# Patient Record
Sex: Female | Born: 1955 | Race: Black or African American | Hispanic: No | Smoking: Never smoker
Health system: Southern US, Community
[De-identification: ages and names within clinical notes are randomized; demographics above are authoritative.]

## PROBLEM LIST (undated history)

## (undated) DIAGNOSIS — E119 Type 2 diabetes mellitus without complications: Secondary | ICD-10-CM

## (undated) DIAGNOSIS — I10 Essential (primary) hypertension: Secondary | ICD-10-CM

## (undated) HISTORY — PX: APPENDECTOMY: SHX54

## (undated) HISTORY — PX: ABDOMINAL HYSTERECTOMY: SHX81

## (undated) HISTORY — PX: KNEE ARTHROPLASTY: SHX992

---

## 1998-12-06 ENCOUNTER — Encounter: Payer: Self-pay | Admitting: Internal Medicine

## 1998-12-06 ENCOUNTER — Emergency Department (HOSPITAL_COMMUNITY): Admission: EM | Admit: 1998-12-06 | Discharge: 1998-12-06 | Payer: Self-pay | Admitting: Internal Medicine

## 1999-09-27 ENCOUNTER — Encounter: Admission: RE | Admit: 1999-09-27 | Discharge: 1999-09-27 | Payer: Self-pay | Admitting: Orthopaedic Surgery

## 1999-09-27 ENCOUNTER — Encounter: Payer: Self-pay | Admitting: Orthopaedic Surgery

## 1999-12-13 ENCOUNTER — Other Ambulatory Visit: Admission: RE | Admit: 1999-12-13 | Discharge: 1999-12-13 | Payer: Self-pay | Admitting: Obstetrics and Gynecology

## 2000-02-21 ENCOUNTER — Ambulatory Visit (HOSPITAL_BASED_OUTPATIENT_CLINIC_OR_DEPARTMENT_OTHER): Admission: RE | Admit: 2000-02-21 | Discharge: 2000-02-21 | Payer: Self-pay | Admitting: Orthopaedic Surgery

## 2000-03-06 ENCOUNTER — Encounter: Admission: RE | Admit: 2000-03-06 | Discharge: 2000-05-08 | Payer: Self-pay | Admitting: Orthopaedic Surgery

## 2000-11-07 ENCOUNTER — Encounter: Payer: Self-pay | Admitting: Obstetrics and Gynecology

## 2000-11-07 ENCOUNTER — Ambulatory Visit (HOSPITAL_COMMUNITY): Admission: RE | Admit: 2000-11-07 | Discharge: 2000-11-07 | Payer: Self-pay | Admitting: Internal Medicine

## 2000-12-25 ENCOUNTER — Other Ambulatory Visit: Admission: RE | Admit: 2000-12-25 | Discharge: 2000-12-25 | Payer: Self-pay | Admitting: Obstetrics and Gynecology

## 2000-12-26 ENCOUNTER — Inpatient Hospital Stay (HOSPITAL_COMMUNITY): Admission: RE | Admit: 2000-12-26 | Discharge: 2000-12-30 | Payer: Self-pay | Admitting: Obstetrics and Gynecology

## 2001-02-25 ENCOUNTER — Ambulatory Visit (HOSPITAL_COMMUNITY): Admission: RE | Admit: 2001-02-25 | Discharge: 2001-02-25 | Payer: Self-pay | Admitting: Obstetrics and Gynecology

## 2001-02-25 ENCOUNTER — Encounter: Payer: Self-pay | Admitting: Obstetrics and Gynecology

## 2001-02-26 ENCOUNTER — Encounter: Admission: RE | Admit: 2001-02-26 | Discharge: 2001-04-23 | Payer: Self-pay | Admitting: Orthopaedic Surgery

## 2001-03-05 ENCOUNTER — Encounter: Payer: Self-pay | Admitting: Obstetrics and Gynecology

## 2001-03-05 ENCOUNTER — Ambulatory Visit (HOSPITAL_COMMUNITY): Admission: RE | Admit: 2001-03-05 | Discharge: 2001-03-05 | Payer: Self-pay | Admitting: Obstetrics and Gynecology

## 2001-05-07 ENCOUNTER — Encounter: Payer: Self-pay | Admitting: Obstetrics and Gynecology

## 2001-05-07 ENCOUNTER — Ambulatory Visit (HOSPITAL_COMMUNITY): Admission: RE | Admit: 2001-05-07 | Discharge: 2001-05-07 | Payer: Self-pay | Admitting: Obstetrics and Gynecology

## 2001-06-18 ENCOUNTER — Ambulatory Visit (HOSPITAL_COMMUNITY): Admission: RE | Admit: 2001-06-18 | Discharge: 2001-06-18 | Payer: Self-pay | Admitting: Obstetrics and Gynecology

## 2015-08-29 ENCOUNTER — Emergency Department (HOSPITAL_COMMUNITY)
Admission: EM | Admit: 2015-08-29 | Discharge: 2015-08-30 | Disposition: A | Discharge status: Home / Self Care | Payer: BLUE CROSS/BLUE SHIELD | Attending: Emergency Medicine | Admitting: Emergency Medicine

## 2015-08-29 ENCOUNTER — Encounter (HOSPITAL_COMMUNITY): Payer: Self-pay | Admitting: Emergency Medicine

## 2015-08-29 ENCOUNTER — Emergency Department (HOSPITAL_COMMUNITY): Payer: BLUE CROSS/BLUE SHIELD

## 2015-08-29 DIAGNOSIS — R0602 Shortness of breath: Secondary | ICD-10-CM | POA: Diagnosis not present

## 2015-08-29 DIAGNOSIS — I10 Essential (primary) hypertension: Secondary | ICD-10-CM | POA: Diagnosis not present

## 2015-08-29 DIAGNOSIS — R05 Cough: Secondary | ICD-10-CM | POA: Diagnosis not present

## 2015-08-29 DIAGNOSIS — E119 Type 2 diabetes mellitus without complications: Secondary | ICD-10-CM | POA: Diagnosis not present

## 2015-08-29 DIAGNOSIS — R000 Tachycardia, unspecified: Secondary | ICD-10-CM | POA: Insufficient documentation

## 2015-08-29 DIAGNOSIS — Z79899 Other long term (current) drug therapy: Secondary | ICD-10-CM | POA: Insufficient documentation

## 2015-08-29 DIAGNOSIS — R059 Cough, unspecified: Secondary | ICD-10-CM

## 2015-08-29 HISTORY — DX: Type 2 diabetes mellitus without complications: E11.9

## 2015-08-29 HISTORY — DX: Essential (primary) hypertension: I10

## 2015-08-29 LAB — CBC
HEMATOCRIT: 41.5 % (ref 36.0–46.0)
HEMOGLOBIN: 14.4 g/dL (ref 12.0–15.0)
MCH: 29.1 pg (ref 26.0–34.0)
MCHC: 34.7 g/dL (ref 30.0–36.0)
MCV: 84 fL (ref 78.0–100.0)
Platelets: 289 10*3/uL (ref 150–400)
RBC: 4.94 MIL/uL (ref 3.87–5.11)
RDW: 12.5 % (ref 11.5–15.5)
WBC: 7.3 10*3/uL (ref 4.0–10.5)

## 2015-08-29 LAB — D-DIMER, QUANTITATIVE: D-Dimer, Quant: <0.27 ug/mL-FEU (ref 0.00–0.48)

## 2015-08-29 LAB — BASIC METABOLIC PANEL
ANION GAP: 9 (ref 5–15)
BUN: 16 mg/dL (ref 6–20)
CALCIUM: 9.3 mg/dL (ref 8.9–10.3)
CO2: 26 mmol/L (ref 22–32)
Chloride: 101 mmol/L (ref 101–111)
Creatinine, Ser: 1.03 mg/dL — ABNORMAL HIGH (ref 0.44–1.00)
GFR calc non Af Amer: 58 mL/min — ABNORMAL LOW (ref >60)
GLUCOSE: 287 mg/dL — AB (ref 65–99)
POTASSIUM: 3.6 mmol/L (ref 3.5–5.1)
Sodium: 136 mmol/L (ref 135–145)

## 2015-08-29 LAB — I-STAT TROPONIN, ED: Troponin i, poc: 0 ng/mL (ref 0.00–0.08)

## 2015-08-29 MED ORDER — LOSARTAN POTASSIUM 100 MG PO TABS: 100.0000 mg | ORAL_TABLET | Freq: Every day | ORAL | Status: AC

## 2015-08-29 MED ORDER — ALBUTEROL SULFATE HFA 108 (90 BASE) MCG/ACT IN AERS
2.0000 | INHALATION_SPRAY | RESPIRATORY_TRACT | Status: DC | PRN
  Administered 2015-08-30: 2 via RESPIRATORY_TRACT
  Filled 2015-08-29: qty 6.7

## 2015-08-29 MED ORDER — ALBUTEROL SULFATE (2.5 MG/3ML) 0.083% IN NEBU
5.0000 mg | INHALATION_SOLUTION | Freq: Once | RESPIRATORY_TRACT | Status: AC
  Administered 2015-08-29: 5 mg via RESPIRATORY_TRACT
  Filled 2015-08-29: qty 6

## 2015-08-29 MED ORDER — SUCRALFATE 1 GM/10ML PO SUSP: 1.0000 g | Freq: Three times a day (TID) | ORAL | Status: AC

## 2015-08-29 MED ORDER — LORAZEPAM 1 MG PO TABS
1.0000 mg | ORAL_TABLET | Freq: Once | ORAL | Status: AC
  Administered 2015-08-29: 1 mg via ORAL
  Filled 2015-08-29: qty 1

## 2015-08-29 MED ORDER — SIMETHICONE 80 MG PO CHEW: 80.0000 mg | CHEWABLE_TABLET | Freq: Four times a day (QID) | ORAL | Status: AC | PRN

## 2015-08-29 MED ORDER — METFORMIN HCL ER 500 MG PO TB24: 1000.0000 mg | ORAL_TABLET | Freq: Every day | ORAL | Status: AC

## 2015-08-29 NOTE — ED Provider Notes (Signed)
CSN: [id number]     Arrival date & time 08/29/15  1942 History   First MD Initiated Contact with Patient 08/29/15 2044     Chief Complaint  Patient presents with  . Shortness of Breath     (Consider location/radiation/quality/duration/timing/severity/associated sxs/prior Treatment) HPI Comments: Patient presents to the emergency department with chief complaint of shortness of breath. Patient states that she noticed the shortness of breath after visiting her friend in [place]. She states that she flew on an airplane to [place]. States that she became short of breath shortly thereafter. She denies any chest pain. She denies any history of blood clot or DVT. She is not anticoagulated. She states that the symptoms are worsened with exertion. She states that she has a slight cough as well. She thinks that her symptoms may be from secondhand smoke. She denies any fevers chills. Denies any nausea vomiting. She has not taken anything to alleviate her symptoms.  The history is provided by the patient. No language interpreter was used.    Past Medical History  Diagnosis Date  . Hypertension   . Diabetes mellitus without complication    Past Surgical History  Procedure Laterality Date  . Appendectomy    . Abdominal hysterectomy    . Cesarean section    . Knee arthroplasty     No family history on file. Social History  Substance Use Topics  . Smoking status: Never Smoker   . Smokeless tobacco: Not on file  . Alcohol Use: Not on file   OB History    No data available     Review of Systems  Constitutional: Negative for fever and chills.  Respiratory: Positive for shortness of breath.   Cardiovascular: Negative for chest pain.  Gastrointestinal: Negative for nausea, vomiting, diarrhea and constipation.  Genitourinary: Negative for dysuria.  All other systems reviewed and are negative.     Allergies  Morphine and related  Home Medications   Prior to Admission medications    Medication Sig Start Date End Date Taking? Authorizing Provider  hydroquinone 4 % cream Apply 1 application topically 2 (two) times daily. 04/23/12  Yes Historical Provider, MD  losartan (COZAAR) 100 MG tablet Take 100 mg by mouth daily. 04/23/12  Yes Historical Provider, MD  metFORMIN (GLUCOPHAGE-XR) 500 MG 24 hr tablet Take 2 tablets by mouth daily. 12/03/12  Yes Historical Provider, MD   BP 152/92 mmHg  Pulse 116  Temp(Src) 98.3 F (36.8 C) (Oral)  Resp 19  SpO2 100% Physical Exam  Constitutional: She is oriented to person, place, and time. She appears well-developed and well-nourished.  HENT:  Head: Normocephalic and atraumatic.  Eyes: Conjunctivae and EOM are normal. Pupils are equal, round, and reactive to light.  Neck: Normal range of motion. Neck supple.  Cardiovascular: Regular rhythm.  Exam reveals no gallop and no friction rub.   No murmur heard. tachycardic  Pulmonary/Chest: Effort normal and breath sounds normal. No respiratory distress. She has no wheezes. She has no rales. She exhibits no tenderness.  CTAB  Abdominal: Soft. Bowel sounds are normal. She exhibits no distension and no mass. There is no tenderness. There is no rebound and no guarding.  Musculoskeletal: Normal range of motion. She exhibits no edema or tenderness.  Neurological: She is alert and oriented to person, place, and time.  Skin: Skin is warm and dry.  Psychiatric: She has a normal mood and affect. Her behavior is normal. Judgment and thought content normal.  Nursing note and vitals reviewed.  ED Course  Procedures (including critical care time) Results for orders placed or performed during the hospital encounter of 08/29/15  D-dimer, quantitative (not at [facility])  Result Value Ref Range   D-Dimer, Quant <0.27 0.00 - 0.48 ug/mL-FEU  CBC  Result Value Ref Range   WBC 7.3 4.0 - 10.5 K/uL   RBC 4.94 3.87 - 5.11 MIL/uL   Hemoglobin 14.4 12.0 - 15.0 g/dL   HCT 19.1 47.8 - 29.5 %   MCV 84.0 78.0 -  100.0 fL   MCH 29.1 26.0 - 34.0 pg   MCHC 34.7 30.0 - 36.0 g/dL   RDW 62.1 30.8 - 65.7 %   Platelets 289 150 - 400 K/uL  Basic metabolic panel  Result Value Ref Range   Sodium 136 135 - 145 mmol/L   Potassium 3.6 3.5 - 5.1 mmol/L   Chloride 101 101 - 111 mmol/L   CO2 26 22 - 32 mmol/L   Glucose, Bld 287 (H) 65 - 99 mg/dL   BUN 16 6 - 20 mg/dL   Creatinine, Ser 8.46 (H) 0.44 - 1.00 mg/dL   Calcium 9.3 8.9 - 96.2 mg/dL   GFR calc non Af Amer 58 (L) >60 mL/min   GFR calc Af Amer >60 >60 mL/min   Anion gap 9 5 - 15  I-Stat Troponin, ED (not at [facility])  Result Value Ref Range   Troponin i, poc 0.00 0.00 - 0.08 ng/mL   Comment 3           Dg Chest 2 View  08/29/2015   CLINICAL DATA:  Cough, wheezing and chest tightness.  EXAM: CHEST  2 VIEW  COMPARISON:  None.  FINDINGS: The lungs are clear. Heart size is normal. No pneumothorax or pleural effusion. No focal bony abnormality.  IMPRESSION: Negative chest.   Electronically Signed   By: Drusilla Kanner M.D.   On: 08/29/2015 20:30      MDM   Final diagnoses:  Cough  SOB (shortness of breath)    Patient with shortness of breath following recent travel. She is also noted to be tachycardic. She has never had a DVT or PE before. She does not have any unilateral leg swelling. She does not have any chest pain.  PERC criteria cannot be applied due to age and tachycardia.  Well's criteria is 4.5.  Will check d-dimer and labs.  D-dimer is negative. Patient does not have any chest pain. Labs are otherwise reassuring with the exception of elevated blood glucose. I have refilled the patient's metformin. Recommend that she follow-up with [facility]. Patient is stable and her for discharge.   Roxy Horseman, PA-C 08/30/15 0000  Derwood Kaplan, MD 09/06/15 [phone]

## 2015-08-29 NOTE — ED Notes (Signed)
Pt states that she has been around a friend all week that smokes and removed herself from the house but still feels SOB. Tachycardic. 100% RA. Alert and oriented.

## 2015-08-29 NOTE — ED Notes (Signed)
EMT Riki Rusk attempted to collect labs. She reports " I need something to calm me down first". She reports to EMT that they normally give her medications to calm her the night before lab draws, 1 hr before, and immediately before blood draw.  PA Rob aware that patient wants time to have medication to kick in before lab draw. There will be a delay in lab collection which may interfer with care. Pt is aware.

## 2015-08-29 NOTE — ED Notes (Signed)
Patient refuse blood draw, states pill did not work

## 2015-08-29 NOTE — Discharge Instructions (Signed)
Cough, Adult   A cough is a reflex that helps clear your throat and airways. It can help heal the body or may be a reaction to an irritated airway. A cough may only last 2 or 3 weeks (acute) or may last more than 8 weeks (chronic).   CAUSES  Acute cough:  · Viral or bacterial infections.  Chronic cough:  · Infections.  · Allergies.  · Asthma.  · Post-nasal drip.  · Smoking.  · Heartburn or acid reflux.  · Some medicines.  · Chronic lung problems (COPD).  · Cancer.  SYMPTOMS   · Cough.  · Fever.  · Chest pain.  · Increased breathing rate.  · High-pitched whistling sound when breathing (wheezing).  · Colored mucus that you cough up (sputum).  TREATMENT   · A bacterial cough may be treated with antibiotic medicine.  · A viral cough must run its course and will not respond to antibiotics.  · Your caregiver may recommend other treatments if you have a chronic cough.  HOME CARE INSTRUCTIONS   · Only take over-the-counter or prescription medicines for pain, discomfort, or fever as directed by your caregiver. Use cough suppressants only as directed by your caregiver.  · Use a cold steam vaporizer or humidifier in your bedroom or home to help loosen secretions.  · Sleep in a semi-upright position if your cough is worse at night.  · Rest as needed.  · Stop smoking if you smoke.  SEEK IMMEDIATE MEDICAL CARE IF:   · You have pus in your sputum.  · Your cough starts to worsen.  · You cannot control your cough with suppressants and are losing sleep.  · You begin coughing up blood.  · You have difficulty breathing.  · You develop pain which is getting worse or is uncontrolled with medicine.  · You have a fever.  MAKE SURE YOU:   · Understand these instructions.  · Will watch your condition.  · Will get help right away if you are not doing well or get worse.  Document Released: 05/19/2011 Document Revised: 02/12/2012 Document Reviewed: 05/19/2011  ExitCare® Patient Information ©2015 [employer]. This information is not intended  to replace advice given to you by your health care provider. Make sure you discuss any questions you have with your health care provider.  Shortness of Breath  Shortness of breath means you have trouble breathing. It could also mean that you have a medical problem. You should get immediate medical care for shortness of breath.  CAUSES   · Not enough oxygen in the air such as with high altitudes or a smoke-filled room.  · Certain lung diseases, infections, or problems.  · Heart disease or conditions, such as angina or heart failure.  · Low red blood cells (anemia).  · Poor physical fitness, which can cause shortness of breath when you exercise.  · Chest or back injuries or stiffness.  · Being overweight.  · Smoking.  · Anxiety, which can make you feel like you are not getting enough air.  DIAGNOSIS   Serious medical problems can often be found during your physical exam. Tests may also be done to determine why you are having shortness of breath. Tests may include:  · Chest X-rays.  · Lung function tests.  · Blood tests.  · An electrocardiogram (ECG).  · An ambulatory electrocardiogram. An ambulatory ECG records your heartbeat patterns over a 24-hour period.  · Exercise testing.  · A transthoracic echocardiogram (TTE).   During echocardiography, sound waves are used to evaluate how blood flows through your heart.  · A transesophageal echocardiogram (TEE).  · Imaging scans.  Your health care provider may not be able to find a cause for your shortness of breath after your exam. In this case, it is important to have a follow-up exam with your health care provider as directed.   TREATMENT   Treatment for shortness of breath depends on the cause of your symptoms and can vary greatly.  HOME CARE INSTRUCTIONS   · Do not smoke. Smoking is a common cause of shortness of breath. If you smoke, ask for help to quit.  · Avoid being around chemicals or things that may bother your breathing, such as paint fumes and dust.  · Rest as  needed. Slowly resume your usual activities.  · If medicines were prescribed, take them as directed for the full length of time directed. This includes oxygen and any inhaled medicines.  · Keep all follow-up appointments as directed by your health care provider.  SEEK MEDICAL CARE IF:   · Your condition does not improve in the time expected.  · You have a hard time doing your normal activities even with rest.  · You have any new symptoms.  SEEK IMMEDIATE MEDICAL CARE IF:   · Your shortness of breath gets worse.  · You feel light-headed, faint, or develop a cough not controlled with medicines.  · You start coughing up blood.  · You have pain with breathing.  · You have chest pain or pain in your arms, shoulders, or abdomen.  · You have a fever.  · You are unable to walk up stairs or exercise the way you normally do.  MAKE SURE YOU:  · Understand these instructions.  · Will watch your condition.  · Will get help right away if you are not doing well or get worse.  Document Released: 08/15/2001 Document Revised: 11/25/2013 Document Reviewed: 02/05/2012  ExitCare® Patient Information ©2015 [employer]. This information is not intended to replace advice given to you by your health care provider. Make sure you discuss any questions you have with your health care provider.

## 2015-08-29 NOTE — ED Notes (Signed)
Notified PA Rob

## 2016-09-12 IMAGING — CR DG CHEST 2V
2 series · 2 of 2 positions shown · non-contrast
Comparison: None.

CLINICAL DATA: Cough, wheezing and chest tightness.

EXAM:
CHEST  2 VIEW

[w chest pa]
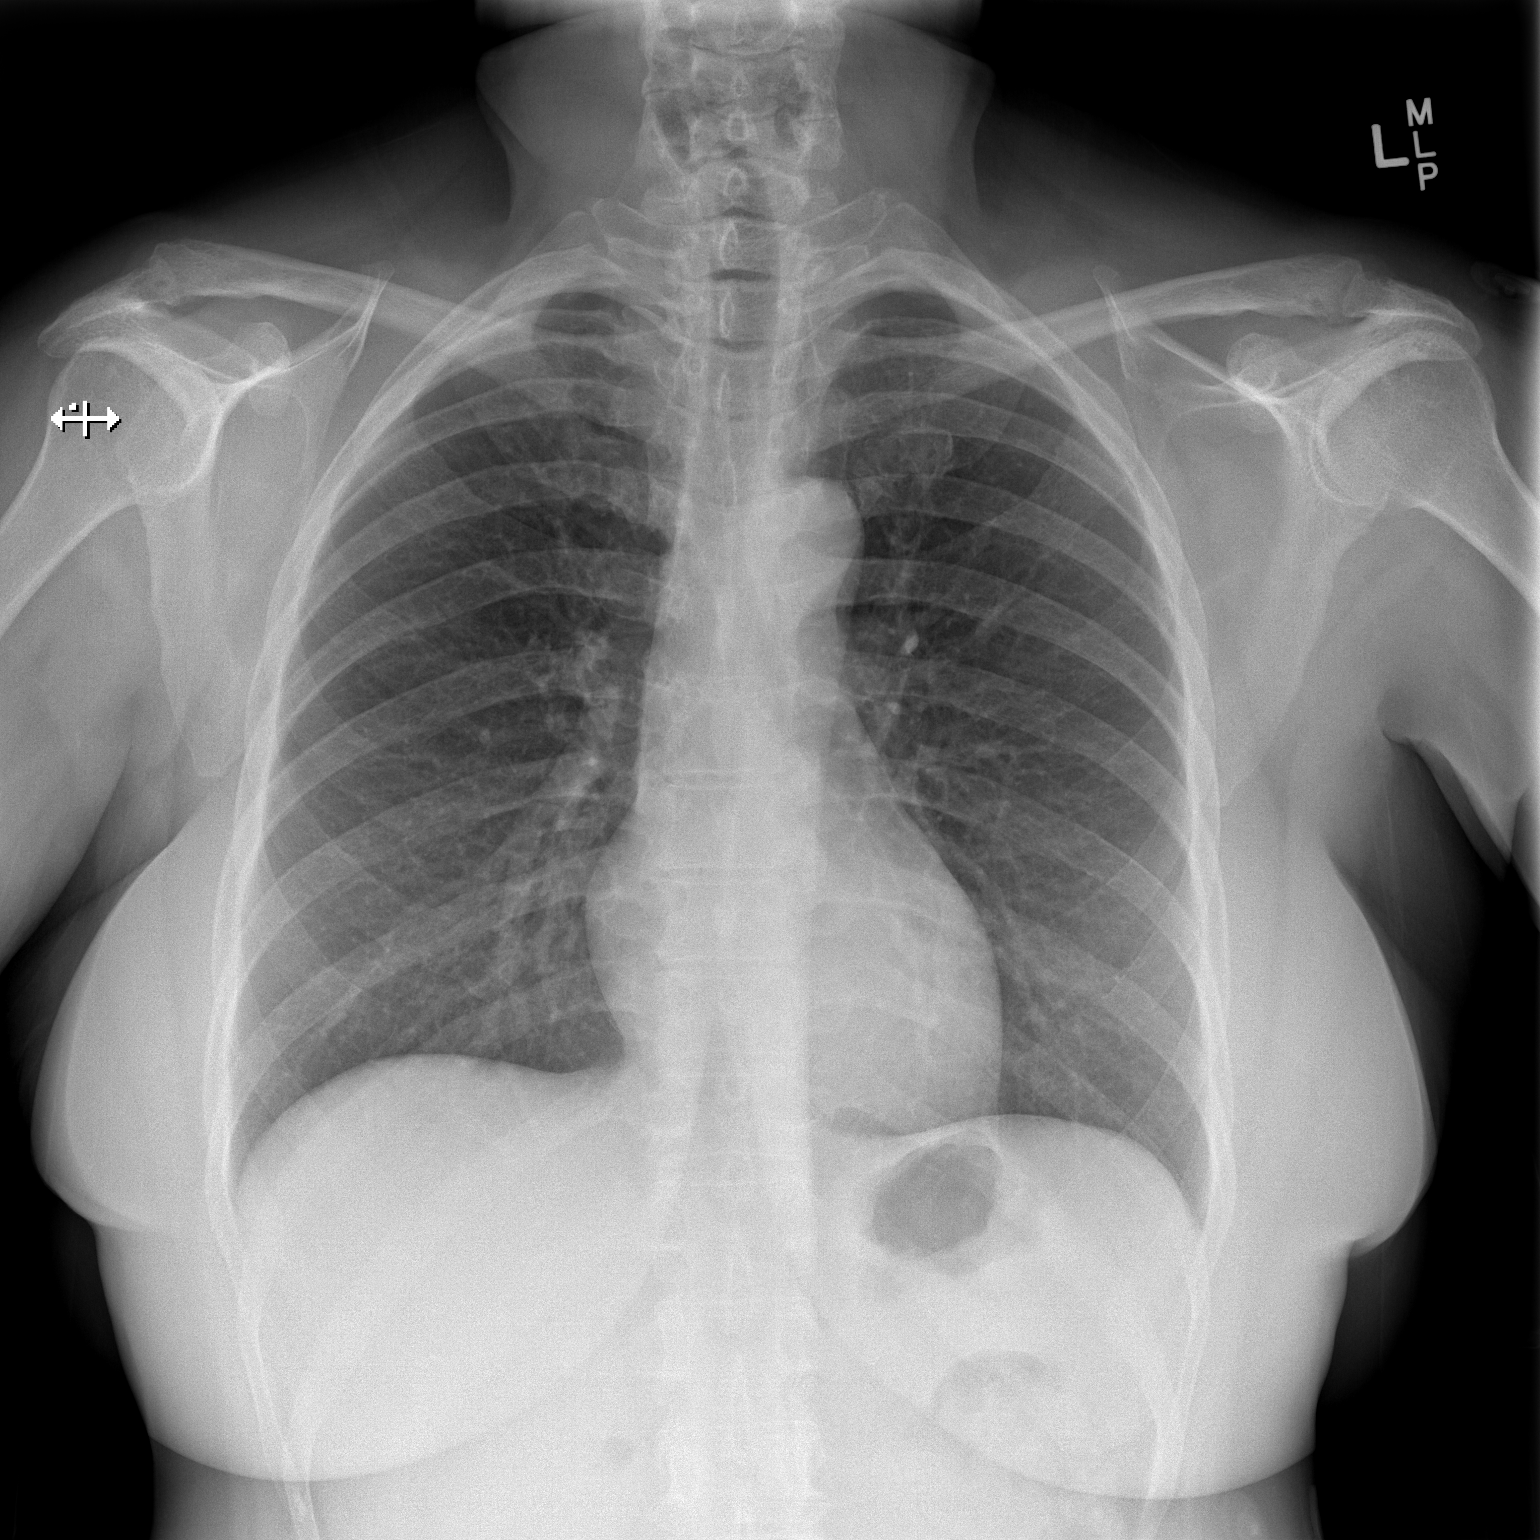

[w chest lat]
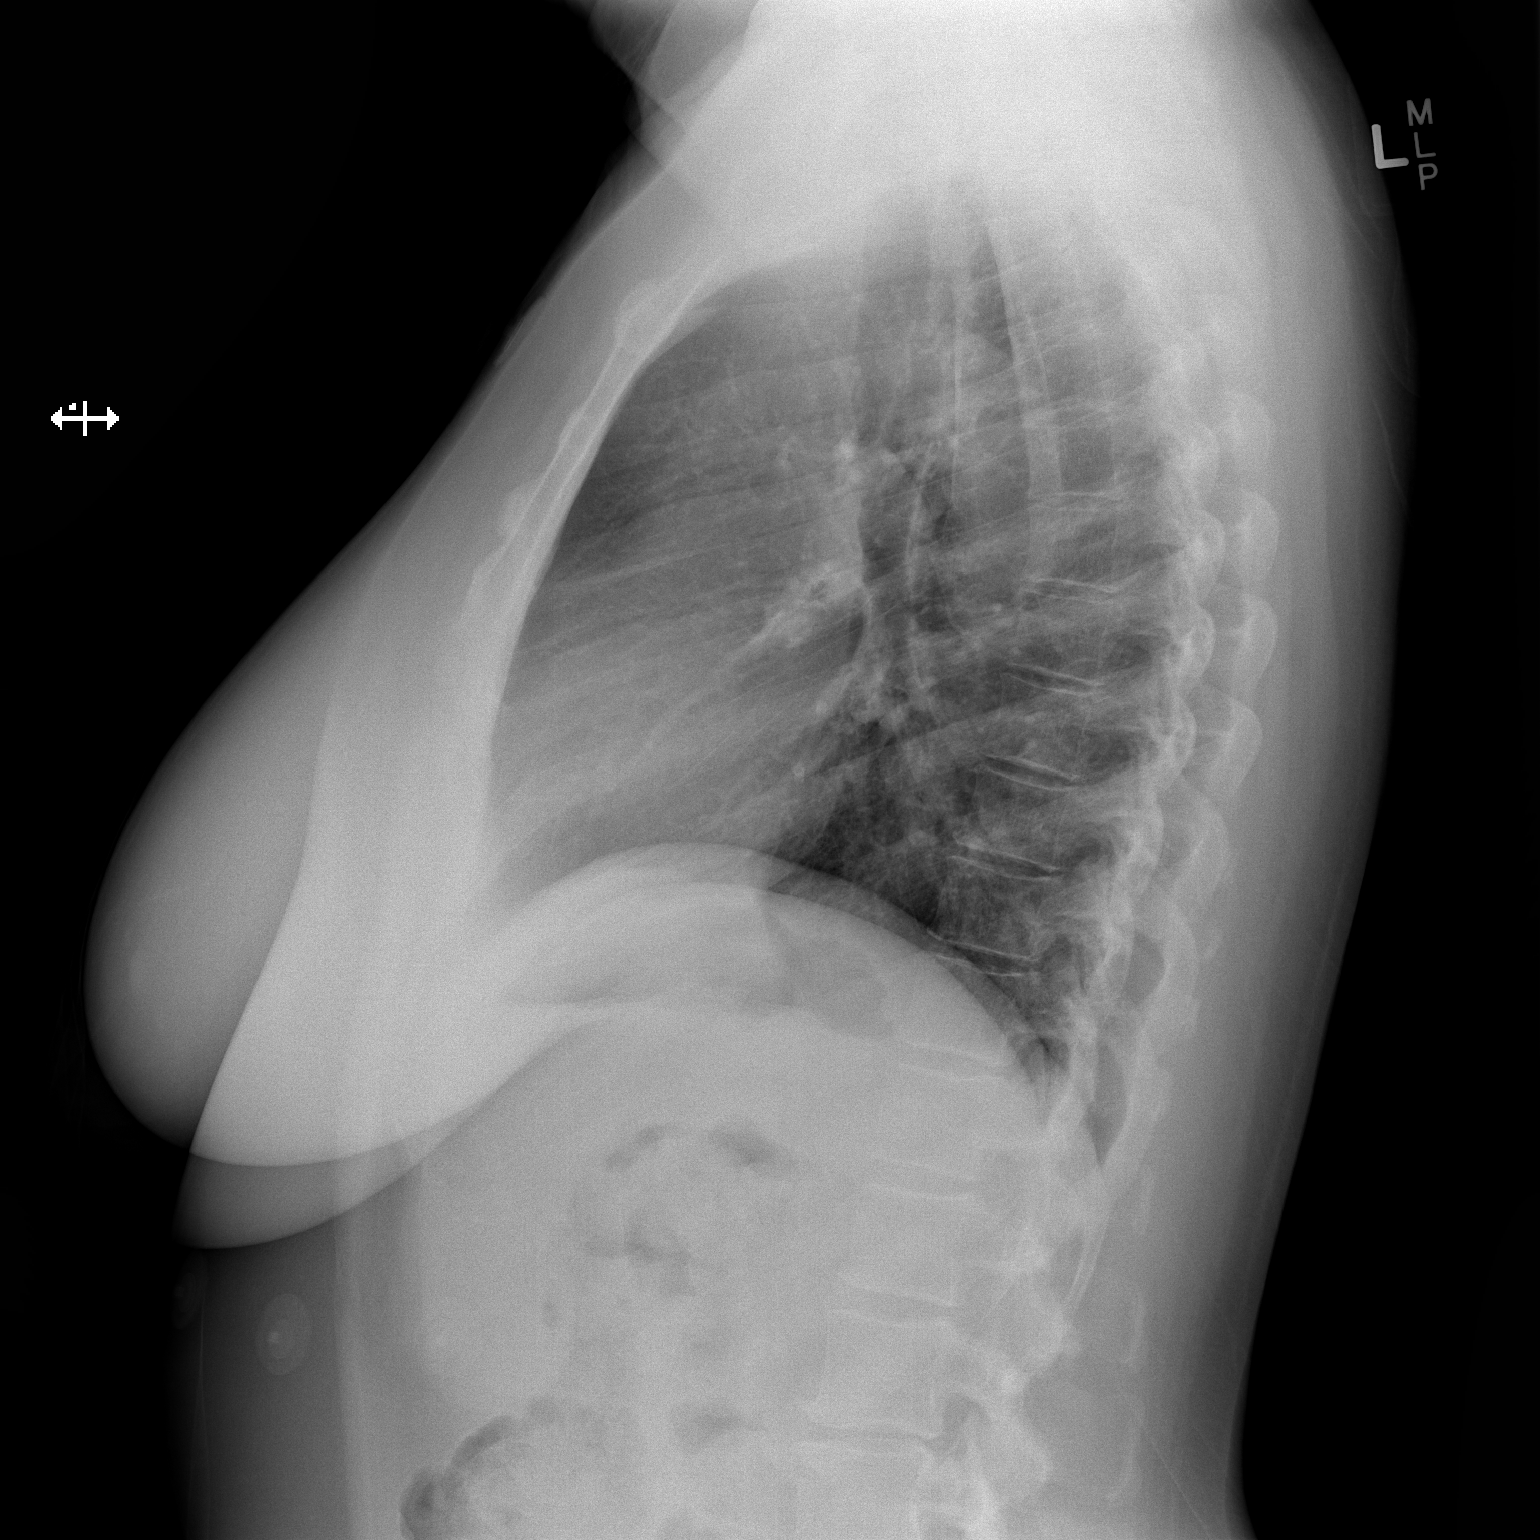

[2 of 2 positions shown; findings below may reference images not displayed]

FINDINGS: The lungs are clear. Heart size is normal. No pneumothorax or
pleural effusion. No focal bony abnormality.
IMPRESSION: Negative chest.
